# Patient Record
Sex: Female | Born: 1975 | Race: White | Hispanic: No | Smoking: Former smoker
Health system: Southern US, Community
[De-identification: ages and names within clinical notes are randomized; demographics above are authoritative.]

## PROBLEM LIST (undated history)

## (undated) DIAGNOSIS — R000 Tachycardia, unspecified: Secondary | ICD-10-CM

## (undated) DIAGNOSIS — R569 Unspecified convulsions: Secondary | ICD-10-CM

## (undated) DIAGNOSIS — J45909 Unspecified asthma, uncomplicated: Secondary | ICD-10-CM

## (undated) DIAGNOSIS — E119 Type 2 diabetes mellitus without complications: Secondary | ICD-10-CM

## (undated) DIAGNOSIS — J449 Chronic obstructive pulmonary disease, unspecified: Secondary | ICD-10-CM

---

## 2005-08-09 DIAGNOSIS — R569 Unspecified convulsions: Secondary | ICD-10-CM

## 2005-08-09 HISTORY — DX: Unspecified convulsions: R56.9

## 2008-08-09 HISTORY — PX: HERNIA REPAIR: SHX51

## 2016-03-28 ENCOUNTER — Emergency Department (HOSPITAL_BASED_OUTPATIENT_CLINIC_OR_DEPARTMENT_OTHER)
Admission: EM | Admit: 2016-03-28 | Discharge: 2016-03-28 | Disposition: A | Discharge status: Home / Self Care | Payer: Medicaid Other | Attending: Emergency Medicine | Admitting: Emergency Medicine

## 2016-03-28 ENCOUNTER — Encounter (HOSPITAL_BASED_OUTPATIENT_CLINIC_OR_DEPARTMENT_OTHER): Payer: Self-pay | Admitting: *Deleted

## 2016-03-28 ENCOUNTER — Emergency Department (HOSPITAL_BASED_OUTPATIENT_CLINIC_OR_DEPARTMENT_OTHER): Payer: Medicaid Other

## 2016-03-28 DIAGNOSIS — R109 Unspecified abdominal pain: Secondary | ICD-10-CM | POA: Diagnosis present

## 2016-03-28 DIAGNOSIS — E119 Type 2 diabetes mellitus without complications: Secondary | ICD-10-CM | POA: Insufficient documentation

## 2016-03-28 DIAGNOSIS — K439 Ventral hernia without obstruction or gangrene: Secondary | ICD-10-CM | POA: Diagnosis not present

## 2016-03-28 DIAGNOSIS — F17210 Nicotine dependence, cigarettes, uncomplicated: Secondary | ICD-10-CM | POA: Insufficient documentation

## 2016-03-28 DIAGNOSIS — R197 Diarrhea, unspecified: Secondary | ICD-10-CM | POA: Insufficient documentation

## 2016-03-28 DIAGNOSIS — Z794 Long term (current) use of insulin: Secondary | ICD-10-CM | POA: Insufficient documentation

## 2016-03-28 DIAGNOSIS — J449 Chronic obstructive pulmonary disease, unspecified: Secondary | ICD-10-CM | POA: Insufficient documentation

## 2016-03-28 HISTORY — DX: Chronic obstructive pulmonary disease, unspecified: J44.9

## 2016-03-28 HISTORY — DX: Unspecified convulsions: R56.9

## 2016-03-28 HISTORY — DX: Unspecified asthma, uncomplicated: J45.909

## 2016-03-28 HISTORY — DX: Tachycardia, unspecified: R00.0

## 2016-03-28 HISTORY — DX: Type 2 diabetes mellitus without complications: E11.9

## 2016-03-28 LAB — CBC WITH DIFFERENTIAL/PLATELET
BASOS PCT: 0 %
Band Neutrophils: 0 %
Basophils Absolute: 0 10*3/uL (ref 0.0–0.1)
Blasts: 0 %
EOS PCT: 2 %
Eosinophils Absolute: 0.2 10*3/uL (ref 0.0–0.7)
HEMATOCRIT: 43.4 % (ref 36.0–46.0)
HEMOGLOBIN: 14.7 g/dL (ref 12.0–15.0)
Lymphocytes Relative: 30 %
Lymphs Abs: 2.8 10*3/uL (ref 0.7–4.0)
MCH: 30.4 pg (ref 26.0–34.0)
MCHC: 33.9 g/dL (ref 30.0–36.0)
MCV: 89.7 fL (ref 78.0–100.0)
MONO ABS: 0.6 10*3/uL (ref 0.1–1.0)
MONOS PCT: 7 %
MYELOCYTES: 0 %
Metamyelocytes Relative: 0 %
NEUTROS ABS: 5.6 10*3/uL (ref 1.7–7.7)
NRBC: 0 /100{WBCs}
Neutrophils Relative %: 61 %
Platelets: ADEQUATE 10*3/uL (ref 150–400)
Promyelocytes Absolute: 0 %
RBC: 4.84 MIL/uL (ref 3.87–5.11)
RDW: 13.8 % (ref 11.5–15.5)
WBC: 9.2 10*3/uL (ref 4.0–10.5)

## 2016-03-28 LAB — URINALYSIS, ROUTINE W REFLEX MICROSCOPIC
Bilirubin Urine: NEGATIVE
Glucose, UA: NEGATIVE mg/dL
Hgb urine dipstick: NEGATIVE
KETONES UR: NEGATIVE mg/dL
LEUKOCYTES UA: NEGATIVE
NITRITE: NEGATIVE
PROTEIN: NEGATIVE mg/dL
Specific Gravity, Urine: 1.021 (ref 1.005–1.030)
pH: 6.5 (ref 5.0–8.0)

## 2016-03-28 LAB — COMPREHENSIVE METABOLIC PANEL
ALK PHOS: 59 U/L (ref 38–126)
ALT: 15 U/L (ref 14–54)
ANION GAP: 8 (ref 5–15)
AST: 18 U/L (ref 15–41)
Albumin: 4 g/dL (ref 3.5–5.0)
BILIRUBIN TOTAL: 0.2 mg/dL — AB (ref 0.3–1.2)
BUN: 10 mg/dL (ref 6–20)
CALCIUM: 9.1 mg/dL (ref 8.9–10.3)
CO2: 22 mmol/L (ref 22–32)
Chloride: 108 mmol/L (ref 101–111)
Creatinine, Ser: 0.66 mg/dL (ref 0.44–1.00)
GFR calc non Af Amer: >60 mL/min (ref >60)
Glucose, Bld: 92 mg/dL (ref 65–99)
POTASSIUM: 4 mmol/L (ref 3.5–5.1)
SODIUM: 138 mmol/L (ref 135–145)
TOTAL PROTEIN: 7.2 g/dL (ref 6.5–8.1)

## 2016-03-28 LAB — PREGNANCY, URINE: PREG TEST UR: NEGATIVE

## 2016-03-28 LAB — I-STAT CG4 LACTIC ACID, ED: LACTIC ACID, VENOUS: 1.81 mmol/L (ref 0.5–1.9)

## 2016-03-28 LAB — LIPASE, BLOOD: LIPASE: 37 U/L (ref 11–51)

## 2016-03-28 MED ORDER — SODIUM CHLORIDE 0.9 % IV SOLN
Freq: Once | INTRAVENOUS | Status: AC
  Administered 2016-03-28: 12:00:00 via INTRAVENOUS

## 2016-03-28 MED ORDER — ONDANSETRON 4 MG PO TBDP
ORAL_TABLET | ORAL | Status: AC
  Filled 2016-03-28: qty 1

## 2016-03-28 MED ORDER — HYDROCODONE-ACETAMINOPHEN 5-325 MG PO TABS
1.0000 | ORAL_TABLET | Freq: Once | ORAL | Status: AC
  Administered 2016-03-28: 1 via ORAL
  Filled 2016-03-28: qty 1

## 2016-03-28 MED ORDER — ONDANSETRON 4 MG PREPACK (~~LOC~~): 1.0000 | ORAL_TABLET | Freq: Three times a day (TID) | ORAL | 0 refills | Status: DC | PRN

## 2016-03-28 MED ORDER — IOPAMIDOL (ISOVUE-300) INJECTION 61%
100.0000 mL | Freq: Once | INTRAVENOUS | Status: AC | PRN
  Administered 2016-03-28: 100 mL via INTRAVENOUS

## 2016-03-28 MED ORDER — FENTANYL CITRATE (PF) 100 MCG/2ML IJ SOLN
50.0000 ug | Freq: Once | INTRAMUSCULAR | Status: AC
  Administered 2016-03-28: 50 ug via INTRAVENOUS
  Filled 2016-03-28: qty 2

## 2016-03-28 MED ORDER — ONDANSETRON HCL 4 MG/2ML IJ SOLN
4.0000 mg | Freq: Once | INTRAMUSCULAR | Status: DC
  Filled 2016-03-28: qty 2

## 2016-03-28 MED ORDER — ONDANSETRON 4 MG PO TBDP
4.0000 mg | ORAL_TABLET | Freq: Once | ORAL | Status: AC
  Administered 2016-03-28: 4 mg via ORAL

## 2016-03-28 NOTE — ED Provider Notes (Signed)
[facility] Note   CSN: [id number] Arrival date & time: 03/28/16  0906     History   Chief Complaint Chief Complaint  Patient presents with  . Abdominal Pain    incisional hernia    HPI Rebecca Richard is a 40 y.o. female.  Patient states she has a history of an incisional and hernia ever since a C-section in 2010. She did have a repair but states the hernia came back. Her last surgery was in 2010. Her last CT scan was in 2012. She has intermittent pain across her hernia which she said suddenly got worse this morning. She had several episodes of loose stools with some blood streaks which is abnormal for her. She denies any vomiting or fever. Her last vomiting was a week ago which she says is normal as she is normally constipated. She denies any urinary symptoms or vaginal symptoms. She denies any chest pain or shortness of breath. She does have a history of COPD and diabetes. She has not had her medications in several weeks due to being out of her normal living arrangement. However she states her sugars have been well-controlled. She was told she did have a surgery for her hernia repair but nobody would do it due to her comorbidities. States that her abdomen appears more distended than usual. She is not had any vomiting or fever.   The history is provided by the patient.  Abdominal Pain   Associated symptoms include diarrhea. Pertinent negatives include fever, nausea, vomiting, dysuria, hematuria, headaches, arthralgias and myalgias.    Past Medical History:  Diagnosis Date  . COPD (chronic obstructive pulmonary disease) (HCC)   . Diabetes mellitus without complication (HCC)    Past history, stopped taking meds per self several months ago.   . Seizures (HCC) 2007   during surgery for c section.  . Tachycardia     There are no active problems to display for this patient.   Past Surgical History:  Procedure Laterality Date  . CESAREAN SECTION  2007  . HERNIA  REPAIR  2010   Incision hernia repair    OB History    No data available       Home Medications    Prior to Admission medications   Medication Sig Start Date End Date Taking? Authorizing Provider  insulin glargine (LANTUS) 100 UNIT/ML injection Inject into the skin at bedtime.   Yes Historical Provider, MD    Family History No family history on file.  Social History Social History  Substance Use Topics  . Smoking status: Current Every Day Smoker    Packs/day: 0.50    Years: 29.00    Types: Cigarettes  . Smokeless tobacco: Never Used  . Alcohol use No     Allergies   Omnicef [cefdinir]; Other; and Ciprocin-fluocin-procin [fluocinolone]   Review of Systems Review of Systems  Constitutional: Positive for activity change and appetite change. Negative for fatigue and fever.  HENT: Negative for congestion and rhinorrhea.   Eyes: Negative for visual disturbance.  Respiratory: Negative for cough, chest tightness and shortness of breath.   Cardiovascular: Negative for chest pain.  Gastrointestinal: Positive for abdominal pain and diarrhea. Negative for nausea and vomiting.  Genitourinary: Negative for dysuria, hematuria, vaginal bleeding and vaginal discharge.  Musculoskeletal: Negative for arthralgias and myalgias.  Skin: Negative for wound.  Neurological: Negative for dizziness, weakness and headaches.   A complete 10 system review of systems was obtained and all systems are negative except as  noted in the HPI and PMH.   Physical Exam Updated Vital Signs BP 158/87 (BP Location: Right Arm)   Pulse 97   Temp 98.3 F (36.8 C) (Oral)   Resp 24   Ht 5\' 7"  (1.702 m)   Wt 240 lb (108.9 kg)   SpO2 100%   BMI 37.59 kg/m   Physical Exam  Constitutional: She is oriented to person, place, and time. She appears well-developed and well-nourished. No distress.  HENT:  Head: Normocephalic and atraumatic.  Mouth/Throat: Oropharynx is clear and moist. No oropharyngeal  exudate.  Eyes: Conjunctivae and EOM are normal. Pupils are equal, round, and reactive to light.  Neck: Normal range of motion. Neck supple.  No meningismus.  Cardiovascular: Normal rate, regular rhythm, normal heart sounds and intact distal pulses.   No murmur heard. Pulmonary/Chest: Effort normal and breath sounds normal. No respiratory distress. She exhibits no tenderness.  Abdominal: Soft. There is tenderness. There is no rebound and no guarding.  Morbidly obese Tenderness and asymmetric bulging to R lower abdomen across pannus.  No obvious hernia. Entire abdomen soft.   Musculoskeletal: Normal range of motion. She exhibits no edema or tenderness.  No CVAT  Neurological: She is alert and oriented to person, place, and time. No cranial nerve deficit. She exhibits normal muscle tone. Coordination normal.   5/5 strength throughout. CN 2-12 intact.Equal grip strength.   Skin: Skin is warm.  Psychiatric: She has a normal mood and affect. Her behavior is normal.  Nursing note and vitals reviewed.    ED Treatments / Results  Labs (all labs ordered are listed, but only abnormal results are displayed) Labs Reviewed  COMPREHENSIVE METABOLIC PANEL - Abnormal; Notable for the following:       Result Value   Total Bilirubin 0.2 (*)    All other components within normal limits  CBC WITH DIFFERENTIAL/PLATELET  URINALYSIS, ROUTINE W REFLEX MICROSCOPIC (NOT AT [facility])  PREGNANCY, URINE  LIPASE, BLOOD  I-STAT CG4 LACTIC ACID, ED  I-STAT CG4 LACTIC ACID, ED    EKG  EKG Interpretation None       Radiology Ct Abdomen Pelvis W Contrast  Result Date: 03/28/2016 CLINICAL DATA:  Incisional hernia following C-section with subsequent repair. Worsening pain with nausea over the last several days. EXAM: CT ABDOMEN AND PELVIS WITH CONTRAST TECHNIQUE: Multidetector CT imaging of the abdomen and pelvis was performed using the standard protocol following bolus administration of intravenous contrast.  CONTRAST:  100mL ISOVUE-300 IOPAMIDOL (ISOVUE-300) INJECTION 61% COMPARISON:  None. FINDINGS: Lower chest: Lung bases are clear. No pleural or pericardial fluid. Hepatobiliary: Normal appearance of the liver. No calcified gallstones. No ductal dilatation. Pancreas: Normal Spleen: Normal Adrenals/Urinary Tract: Normal adrenal glands.  Normal kidneys. Stomach/Bowel: Normal intrinsically. No obstruction or focal lesion. Large abdominal wall hernia to the left of center with the hernia defect measuring 8 cm in diameter. There is a large amount of herniated mesentery, small bowel and large bowel. Vascular/Lymphatic: Aortic atherosclerosis. No aneurysm. The IVC is normal. No retroperitoneal mass or adenopathy. Reproductive: Uterus and adnexal regions are normal. Other: None. Musculoskeletal:  Negative IMPRESSION: Large anterior abdominal wall hernia to left to midline with the hernia defect measuring 8 cm in diameter. Hernia contains a large amount of mesenteric fat, small intestine and large intestine. No evidence of obstruction or incarceration. Electronically Signed   By: Paulina FusiMark  Shogry M.D.   On: 03/28/2016 13:18    Procedures Procedures (including critical care time)  Medications Ordered in ED Medications  0.9 %  sodium chloride infusion (not administered)  ondansetron (ZOFRAN) injection 4 mg (not administered)     Initial Impression / Assessment and Plan / ED Course  I have reviewed the triage vital signs and the nursing notes.  Pertinent labs & imaging results that were available during my care of the patient were reviewed by me and considered in my medical decision making (see chart for details).  Clinical Course  Patient with worsening abdominal pain at site of apparent incisional hernia. There is no obvious hernia on exam. Her abdomen is soft but difficult to examine due to her obesity. We'll obtain labs and imaging.  Labs are normal. Including lactate. No urine infection. No hyperglycemia,  no DKA.  CT shows large ventral hernia.  No evidence of obstruction or inflammation.  Hernia not fully reducible, but likely chronically so.  D/w Dr. Carolynne Edouardoth of surgery who agrees with outpatient followup. Surgical referral given.  Sugars well controlled despite patient not having insulin for several weeks. Advised to followup with PCP for recheck.  Return precautions discussed.   Final Clinical Impressions(s) / ED Diagnoses   Final diagnoses:  Ventral hernia, recurrence not specified    New Prescriptions New Prescriptions   No medications on file     Glynn OctaveStephen Yesena Reaves, MD 03/28/16 1713

## 2016-03-28 NOTE — Discharge Instructions (Signed)
Follow up with a surgeon to repair your hernia. Return to the ED if you develop worsening pain, fever, vomiting or any other concerns.

## 2016-03-28 NOTE — ED Notes (Signed)
Unsuccessful IV attempt x 2 with ultrasound.  Dr. Manus Gunningancour aware.

## 2016-03-28 NOTE — ED Triage Notes (Signed)
Patient states she has a two or three year history of incision hernia's post c-section.  Has had one repaired and now has returned.  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  Did not have any repair done at the time.  States for the last two days, she has had increased lower abdominal pain with enlarged protruding hernia, and this morning developed diarrhea.

## 2016-04-28 ENCOUNTER — Encounter (HOSPITAL_BASED_OUTPATIENT_CLINIC_OR_DEPARTMENT_OTHER): Payer: Self-pay

## 2016-04-28 ENCOUNTER — Emergency Department (HOSPITAL_BASED_OUTPATIENT_CLINIC_OR_DEPARTMENT_OTHER)
Admission: EM | Admit: 2016-04-28 | Discharge: 2016-04-28 | Disposition: A | Discharge status: Home / Self Care | Payer: Medicaid Other | Attending: Emergency Medicine | Admitting: Emergency Medicine

## 2016-04-28 ENCOUNTER — Emergency Department (HOSPITAL_BASED_OUTPATIENT_CLINIC_OR_DEPARTMENT_OTHER): Payer: Medicaid Other

## 2016-04-28 DIAGNOSIS — J449 Chronic obstructive pulmonary disease, unspecified: Secondary | ICD-10-CM | POA: Diagnosis not present

## 2016-04-28 DIAGNOSIS — J45909 Unspecified asthma, uncomplicated: Secondary | ICD-10-CM | POA: Insufficient documentation

## 2016-04-28 DIAGNOSIS — M25551 Pain in right hip: Secondary | ICD-10-CM | POA: Diagnosis present

## 2016-04-28 DIAGNOSIS — Z79899 Other long term (current) drug therapy: Secondary | ICD-10-CM | POA: Insufficient documentation

## 2016-04-28 DIAGNOSIS — E119 Type 2 diabetes mellitus without complications: Secondary | ICD-10-CM | POA: Insufficient documentation

## 2016-04-28 DIAGNOSIS — F17210 Nicotine dependence, cigarettes, uncomplicated: Secondary | ICD-10-CM | POA: Insufficient documentation

## 2016-04-28 LAB — PREGNANCY, URINE: PREG TEST UR: NEGATIVE

## 2016-04-28 MED ORDER — IBUPROFEN 600 MG PO TABS: 600.0000 mg | ORAL_TABLET | Freq: Four times a day (QID) | ORAL | 0 refills | Status: AC | PRN

## 2016-04-28 MED ORDER — KETOROLAC TROMETHAMINE 60 MG/2ML IM SOLN
60.0000 mg | Freq: Once | INTRAMUSCULAR | Status: AC
  Administered 2016-04-28: 60 mg via INTRAMUSCULAR
  Filled 2016-04-28: qty 2

## 2016-04-28 MED ORDER — METHOCARBAMOL 500 MG PO TABS: 1000.0000 mg | ORAL_TABLET | Freq: Three times a day (TID) | ORAL | 0 refills | Status: DC | PRN

## 2016-04-28 NOTE — ED Notes (Signed)
Dr. Ranae PalmsYelverton notified of pt's request for pain medication

## 2016-04-28 NOTE — ED Notes (Signed)
MD at bedside. 

## 2016-04-28 NOTE — ED Triage Notes (Signed)
C/o "pop" to right hip approx 7am while lifting left leg

## 2016-04-28 NOTE — ED Notes (Signed)
Patient transported to X-ray 

## 2016-04-28 NOTE — ED Notes (Signed)
Directed to pharmacy to pick up prescriptions. Ice pack given for home use 

## 2016-04-28 NOTE — Discharge Instructions (Signed)
Follow-up with Dr. Pearletha ForgeHudnall if your symptoms are not improving with anti-inflammatory, muscle relaxants, rest, ice and elevation.

## 2016-04-28 NOTE — ED Notes (Signed)
MD at bedside discussing f/u care

## 2016-04-28 NOTE — ED Provider Notes (Signed)
[facility]   CSN: [id number] Arrival date & time: 04/28/16  1052     History   Chief Complaint Chief Complaint  Patient presents with  . Hip Pain    HPI Rebecca Richard is a 40 y.o. female.  HPI Patient presents with right hip pain. States she was raising her left leg this morning and she heard a pop in her right hip. She's had pain since. No swelling noted. She's been able to weight bear on the leg since onset of symptoms. No numbness or tingling. No weakness. No previous hip surgery. Past Medical History:  Diagnosis Date  . Asthma   . COPD (chronic obstructive pulmonary disease) (HCC)   . Diabetes mellitus without complication (HCC)    Past history, stopped taking meds per self several months ago.   . Seizures (HCC) 2007   during surgery for c section.  . Tachycardia     There are no active problems to display for this patient.   Past Surgical History:  Procedure Laterality Date  . CESAREAN SECTION  2007  . HERNIA REPAIR  2010   Incision hernia repair    OB History    No data available       Home Medications    Prior to Admission medications   Medication Sig Start Date End Date Taking? Authorizing Provider  DULoxetine (CYMBALTA) 60 MG capsule Take 60 mg by mouth daily.   Yes Historical Provider, MD  esomeprazole (NEXIUM) 40 MG capsule Take 40 mg by mouth daily at 12 noon.   Yes Historical Provider, MD  lisinopril (PRINIVIL,ZESTRIL) 10 MG tablet Take 10 mg by mouth daily.   Yes Historical Provider, MD  montelukast (SINGULAIR) 10 MG tablet Take 10 mg by mouth at bedtime.   Yes Historical Provider, MD  nicotine (NICODERM CQ - DOSED IN MG/24 HOURS) 14 mg/24hr patch Place 14 mg onto the skin daily.   Yes Historical Provider, MD  ibuprofen (ADVIL,MOTRIN) 600 MG tablet Take 1 tablet (600 mg total) by mouth every 6 (six) hours as needed. 04/28/16   Loren Racer, MD  methocarbamol (ROBAXIN) 500 MG tablet Take 2 tablets (1,000 mg total) by  mouth every 8 (eight) hours as needed for muscle spasms. 04/28/16   Loren Racer, MD    Family History No family history on file.  Social History Social History  Substance Use Topics  . Smoking status: Current Every Day Smoker    Packs/day: 0.50    Years: 29.00    Types: Cigarettes  . Smokeless tobacco: Never Used  . Alcohol use No     Allergies   Omnicef [cefdinir]; Other; Ciprofloxacin; and Ciprocin-fluocin-procin [fluocinolone]   Review of Systems Review of Systems  Constitutional: Negative for chills and fever.  Respiratory: Negative for shortness of breath.   Cardiovascular: Negative for chest pain.  Gastrointestinal: Negative for abdominal pain, diarrhea, nausea and vomiting.  Musculoskeletal: Positive for arthralgias. Negative for back pain, myalgias, neck pain and neck stiffness.  Neurological: Negative for dizziness, weakness, light-headedness, numbness and headaches.  All other systems reviewed and are negative.    Physical Exam Updated Vital Signs BP (!) 152/108 (BP Location: Left Arm)   Pulse 119   Temp 98.1 F (36.7 C) (Oral)   Resp 24   Ht 5\' 7"  (1.702 m)   Wt 238 lb (108 kg)   SpO2 100%   BMI 37.28 kg/m   Physical Exam  Constitutional: She is oriented to person, place, and time. She appears  well-developed and well-nourished. No distress.  Anxious  HENT:  Head: Normocephalic and atraumatic.  Eyes: EOM are normal. Pupils are equal, round, and reactive to light.  Neck: Normal range of motion. Neck supple.  Pulmonary/Chest: Effort normal.  Abdominal: Soft. Bowel sounds are normal. There is no tenderness. There is no rebound and no guarding.  Musculoskeletal: Normal range of motion. She exhibits tenderness. She exhibits no edema.  Patient has tender to palpation over the lateral right hip. There is no lower extremity length discrepancy. 2+ dorsalis pedis and posterior tibial pulses. Full range of motion of the left hip. Pain appears to be  exacerbated with external rotation. No lower extremity swelling or asymmetry.  Neurological: She is alert and oriented to person, place, and time.  5/5 motor in all extremities. Sensation is intact.  Skin: Skin is warm and dry. Capillary refill takes less than 2 seconds. No rash noted. No erythema.  Psychiatric: She has a normal mood and affect. Her behavior is normal.  Nursing note and vitals reviewed.    ED Treatments / Results  Labs (all labs ordered are listed, but only abnormal results are displayed) Labs Reviewed  PREGNANCY, URINE    EKG  EKG Interpretation None       Radiology Dg Hip Unilat W Or Wo Pelvis 2-3 Views Right  Result Date: 04/28/2016 CLINICAL DATA:  Right hip pain and right hip popping. EXAM: DG HIP (WITH OR WITHOUT PELVIS) 2-3V RIGHT COMPARISON:  None. FINDINGS: Right hip appears to be located. No evidence for an acute fracture. Pelvic bony ring is intact. There is bowel gas overlying the proximal left femur and this is related to the patient's known large ventral hernia. IMPRESSION: No acute bone abnormality in the pelvis or right hip. Large abdominal hernia containing bowel. Electronically Signed   By: Richarda OverlieAdam  Henn M.D.   On: 04/28/2016 12:39    Procedures Procedures (including critical care time)  Medications Ordered in ED Medications  ketorolac (TORADOL) injection 60 mg (not administered)     Initial Impression / Assessment and Plan / ED Course  I have reviewed the triage vital signs and the nursing notes.  Pertinent labs & imaging results that were available during my care of the patient were reviewed by me and considered in my medical decision making (see chart for details).  Clinical Course   No evidence of injury on x-ray. Suspect muscular injury. Treat with NSAIDs, muscle relaxant and RICE. We'll give follow-up with Dr. Pearletha ForgeHudnall patient's symptoms aren't improving with this treatment. Return precautions given.   Final Clinical Impressions(s) /  ED Diagnoses   Final diagnoses:  Right hip pain    New Prescriptions New Prescriptions   IBUPROFEN (ADVIL,MOTRIN) 600 MG TABLET    Take 1 tablet (600 mg total) by mouth every 6 (six) hours as needed.   METHOCARBAMOL (ROBAXIN) 500 MG TABLET    Take 2 tablets (1,000 mg total) by mouth every 8 (eight) hours as needed for muscle spasms.     Loren Raceravid Jalina Blowers, MD 04/28/16 1300

## 2016-04-28 NOTE — ED Notes (Signed)
Family at bedside. 

## 2016-04-28 NOTE — ED Notes (Signed)
Pt able to ambulate with limp. Standby Assistance used.

## 2016-04-28 NOTE — ED Notes (Signed)
Family at bedside.   Pt was able to ambulate to the bathroom without assistance. Pt requesting pain medicine, RN notified.

## 2016-05-19 ENCOUNTER — Emergency Department (HOSPITAL_BASED_OUTPATIENT_CLINIC_OR_DEPARTMENT_OTHER)
Admission: EM | Admit: 2016-05-19 | Discharge: 2016-05-19 | Disposition: A | Discharge status: Home / Self Care | Payer: Medicaid Other | Attending: Emergency Medicine | Admitting: Emergency Medicine

## 2016-05-19 ENCOUNTER — Encounter (HOSPITAL_BASED_OUTPATIENT_CLINIC_OR_DEPARTMENT_OTHER): Payer: Self-pay

## 2016-05-19 DIAGNOSIS — K029 Dental caries, unspecified: Secondary | ICD-10-CM | POA: Diagnosis not present

## 2016-05-19 DIAGNOSIS — J449 Chronic obstructive pulmonary disease, unspecified: Secondary | ICD-10-CM | POA: Diagnosis not present

## 2016-05-19 DIAGNOSIS — J45909 Unspecified asthma, uncomplicated: Secondary | ICD-10-CM | POA: Diagnosis not present

## 2016-05-19 DIAGNOSIS — E119 Type 2 diabetes mellitus without complications: Secondary | ICD-10-CM | POA: Diagnosis not present

## 2016-05-19 DIAGNOSIS — Z79899 Other long term (current) drug therapy: Secondary | ICD-10-CM | POA: Insufficient documentation

## 2016-05-19 DIAGNOSIS — Z87891 Personal history of nicotine dependence: Secondary | ICD-10-CM | POA: Diagnosis not present

## 2016-05-19 DIAGNOSIS — K0889 Other specified disorders of teeth and supporting structures: Secondary | ICD-10-CM | POA: Diagnosis present

## 2016-05-19 NOTE — ED Provider Notes (Signed)
[facility]   CSN: [id number] Arrival date & time: 05/19/16  1307     History   Chief Complaint Chief Complaint  Patient presents with  . Dental Pain    HPI Rebecca Richard is a 40 y.o. female.  HPI   40 year old female presents today with dental pain. Patient has a history of numerous dental caries status post extraction. Patient reports intermittent right upper dental pain worse with cold or hot. Patient notes that yesterday symptoms significantly worsen, she notes calling her dentist tooth was able to schedule a visit in November of this year. Patient denies any swelling, fever, or any other infectious etiology. Patient reports using ibuprofen with no significant improvement in her pain.    Past Medical History:  Diagnosis Date  . Asthma   . COPD (chronic obstructive pulmonary disease) (HCC)   . Diabetes mellitus without complication (HCC)    Past history, stopped taking meds per self several months ago.   . Seizures (HCC) 2007   during surgery for c section.  . Tachycardia     There are no active problems to display for this patient.   Past Surgical History:  Procedure Laterality Date  . CESAREAN SECTION  2007  . HERNIA REPAIR  2010   Incision hernia repair    OB History    No data available       Home Medications    Prior to Admission medications   Medication Sig Start Date End Date Taking? Authorizing Provider  METOPROLOL TARTRATE PO Take by mouth.   Yes Historical Provider, MD  DULoxetine (CYMBALTA) 60 MG capsule Take 60 mg by mouth daily.    Historical Provider, MD  esomeprazole (NEXIUM) 40 MG capsule Take 40 mg by mouth daily at 12 noon.    Historical Provider, MD  ibuprofen (ADVIL,MOTRIN) 600 MG tablet Take 1 tablet (600 mg total) by mouth every 6 (six) hours as needed. 04/28/16   Loren Raceravid Yelverton, MD  lisinopril (PRINIVIL,ZESTRIL) 10 MG tablet Take 10 mg by mouth daily.    Historical Provider, MD  montelukast (SINGULAIR) 10  MG tablet Take 10 mg by mouth at bedtime.    Historical Provider, MD  nicotine (NICODERM CQ - DOSED IN MG/24 HOURS) 14 mg/24hr patch Place 14 mg onto the skin daily.    Historical Provider, MD    Family History No family history on file.  Social History Social History  Substance Use Topics  . Smoking status: Former Smoker    Packs/day: 0.50    Years: 29.00  . Smokeless tobacco: Never Used  . Alcohol use No     Allergies   Omnicef [cefdinir]; Other; Ciprofloxacin; and Ciprocin-fluocin-procin [fluocinolone]   Review of Systems Review of Systems  All other systems reviewed and are negative.    Physical Exam Updated Vital Signs BP 129/73 (BP Location: Right Arm)   Pulse 82   Temp 98.4 F (36.9 C) (Oral)   Resp 16   Ht 5\' 7"  (1.702 m)   Wt 113.9 kg   SpO2 99%   BMI 39.31 kg/m   Physical Exam  Constitutional: She is oriented to person, place, and time. She appears well-developed and well-nourished. No distress.  HENT:  Head: Normocephalic and atraumatic.  Mouth/Throat: Uvula is midline, oropharynx is clear and moist and mucous membranes are normal. No oropharyngeal exudate, posterior oropharyngeal edema, posterior oropharyngeal erythema or tonsillar abscesses.  External exam shows no asymmetry of the jaw line or face, no signs of obvious swelling,  edema, infection. Full active range of motion of the jaw. Neck is supple with full active range of motion, no tenderness to palpation of the soft tissues  Numerous dental caries and missing teeth    Gumline palpated no obvious signs of infection including warmth, redness, abscess, tenderness. Posterior oropharynx clear with no signs of infection, uvula is midline and rises with phonation, tonsils present and normal in size, symmetrical bilateral, tongue is normal soft touch with full active range of motion, floor mouth is soft nontender.  Eyes: Conjunctivae are normal. Pupils are equal, round, and reactive to light. Right eye  exhibits no discharge. Left eye exhibits no discharge. No scleral icterus.  Neck: Normal range of motion. Neck supple. No JVD present. No tracheal deviation present. No thyromegaly present.  Pulmonary/Chest: Effort normal. No stridor.  Lymphadenopathy:    She has no cervical adenopathy.  Neurological: She is alert and oriented to person, place, and time. Coordination normal.  Skin: Skin is warm and dry. No rash noted. She is not diaphoretic. No erythema. No pallor.  Psychiatric: She has a normal mood and affect. Her behavior is normal. Judgment and thought content normal.  Nursing note and vitals reviewed.    ED Treatments / Results  Labs (all labs ordered are listed, but only abnormal results are displayed) Labs Reviewed - No data to display  EKG  EKG Interpretation None       Radiology No results found.  Procedures Procedures (including critical care time)  Medications Ordered in ED Medications - No data to display   Initial Impression / Assessment and Plan / ED Course  I have reviewed the triage vital signs and the nursing notes.  Pertinent labs & imaging results that were available during my care of the patient were reviewed by me and considered in my medical decision making (see chart for details).  Clinical Course     Final Clinical Impressions(s) / ED Diagnoses   Final diagnoses:  Pain, dental    Labs:   Imaging:  Consults:  Therapeutics:  Discharge Meds:   Assessment/Plan:  Patient presents with some compensated down pain, she will be encouraged follow-up with dental resources.      New Prescriptions Discharge Medication List as of 05/19/2016  3:32 PM       Eyvonne Mechanic, PA-C 05/19/16 1610    Marily Memos, MD 05/20/16 1545

## 2016-05-19 NOTE — ED Triage Notes (Signed)
C/o upper right toothache x 2 days-NAD-steady gait

## 2016-05-19 NOTE — Discharge Instructions (Signed)
Please read attached information. If you experience any new or worsening signs or symptoms please return to the emergency room for evaluation. Please follow-up with your primary care provider or specialist as discussed.  °

## 2017-08-03 IMAGING — CT CT ABD-PELV W/ CM
2 of 5 series · 17 of 46 positions shown, 19 images · IV contrast (APPLIED)
Comparison: None.

CLINICAL DATA: Incisional hernia following C-section with
subsequent repair. Worsening pain with nausea over the last several
days.

EXAM:
CT ABDOMEN AND PELVIS WITH CONTRAST
TECHNIQUE: Multidetector CT imaging of the abdomen and pelvis was performed
using the standard protocol following bolus administration of
intravenous contrast.
CONTRAST:  100mL QGEAKD-HPP IOPAMIDOL (QGEAKD-HPP) INJECTION 61%

[Series 2: axial st · axial · 0.98mm/px · z∈[-555,-95]mm · 14 of 104 slices shown, 16 images]
[im 6/104  soft-tissue]
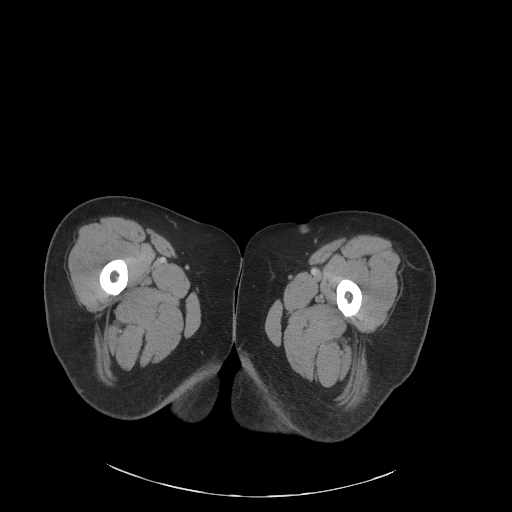
[im 6/104  bone]
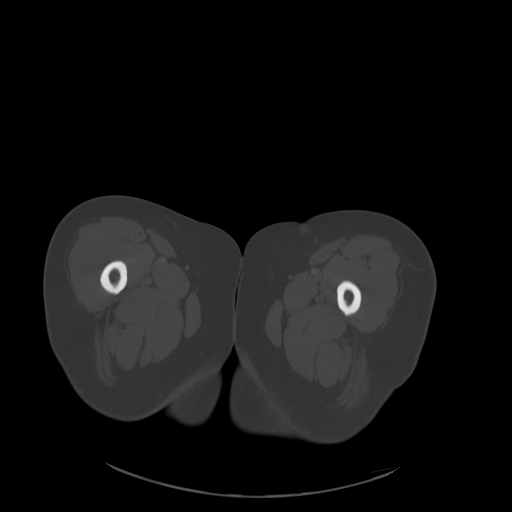
[im 11/104  soft-tissue]
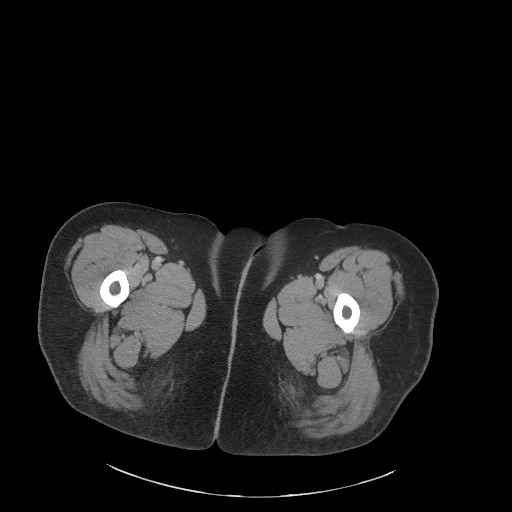
[im 22/104  soft-tissue]
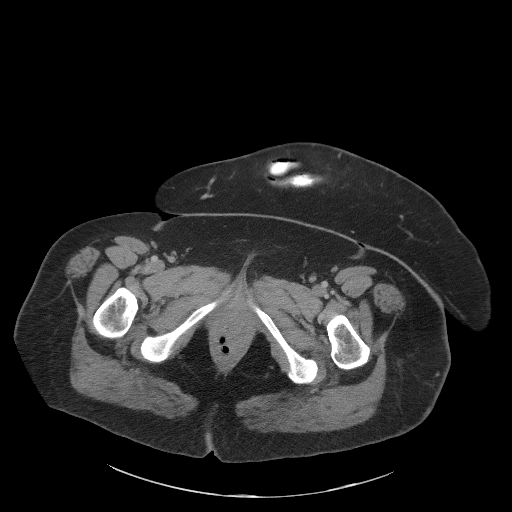
[im 28/104  soft-tissue]
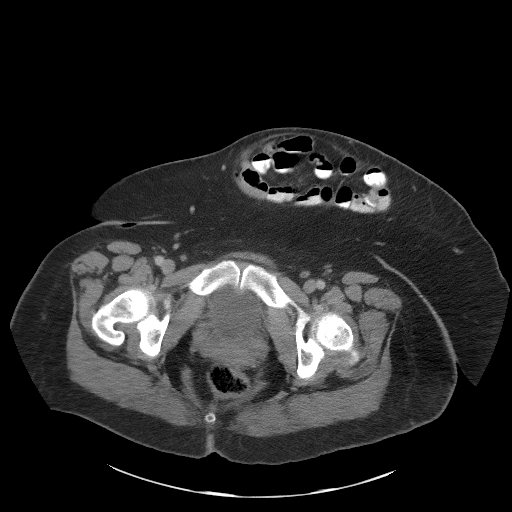
[im 33/104  soft-tissue]
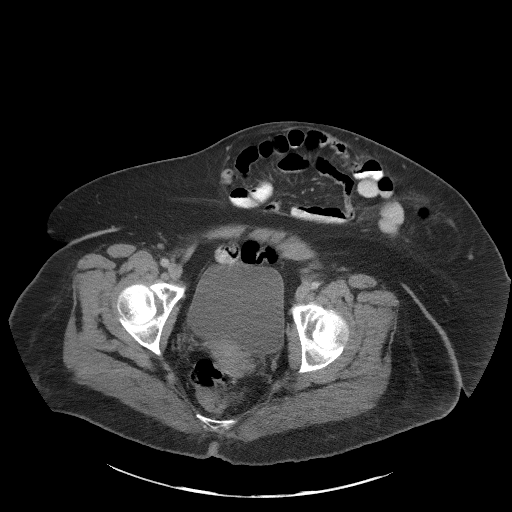
[im 44/104  soft-tissue]
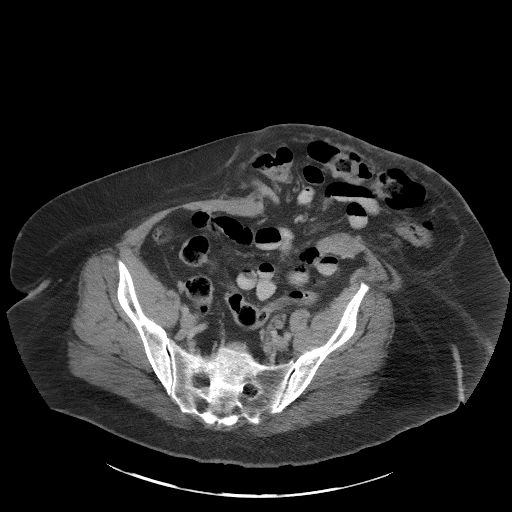
[im 49/104  soft-tissue]
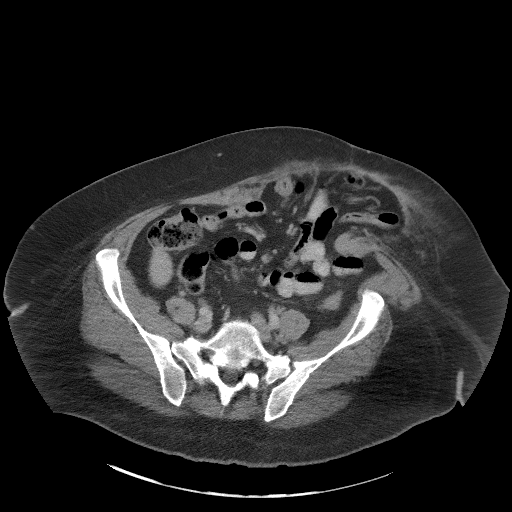
[im 55/104  soft-tissue]
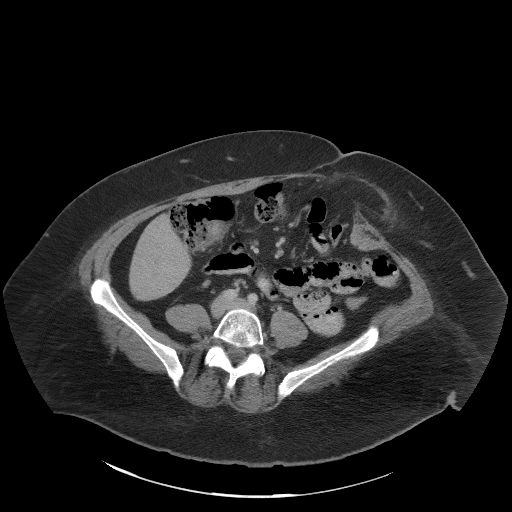
[im 60/104  soft-tissue]
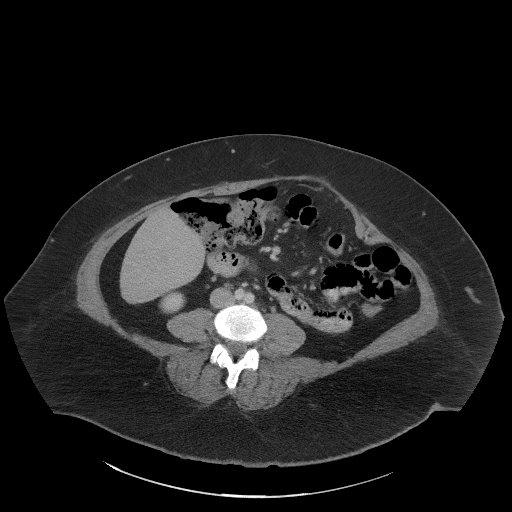
[im 60/104  bone]
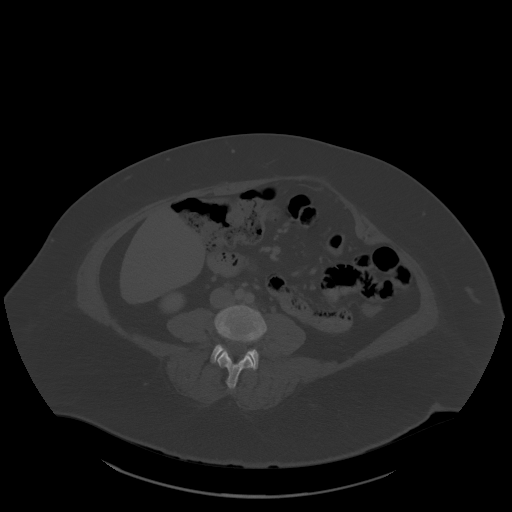
[im 71/104  soft-tissue]
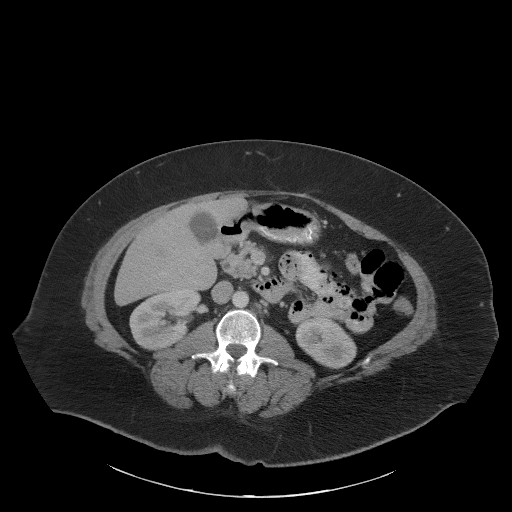
[im 76/104  soft-tissue]
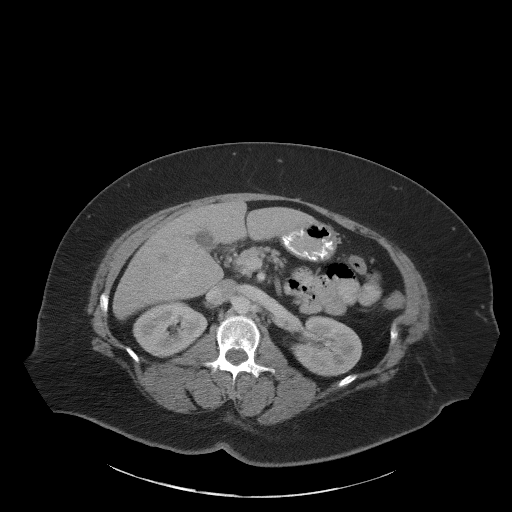
[im 82/104  soft-tissue]
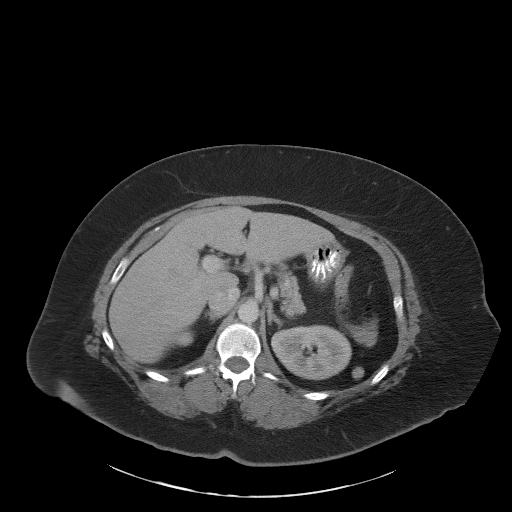
[im 93/104  soft-tissue]
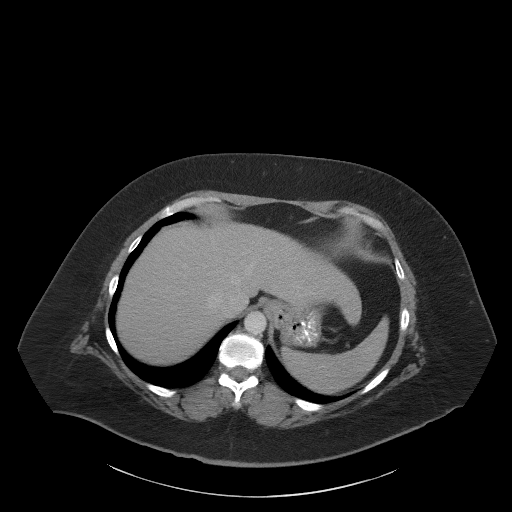
[im 98/104  soft-tissue]
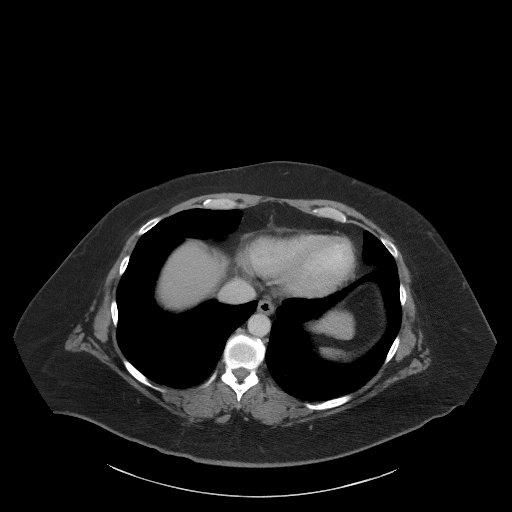

[Series 5: coronal st · coronal · 1.06mm/px · 3 of 107 slices shown]
[im 36/107  soft-tissue]
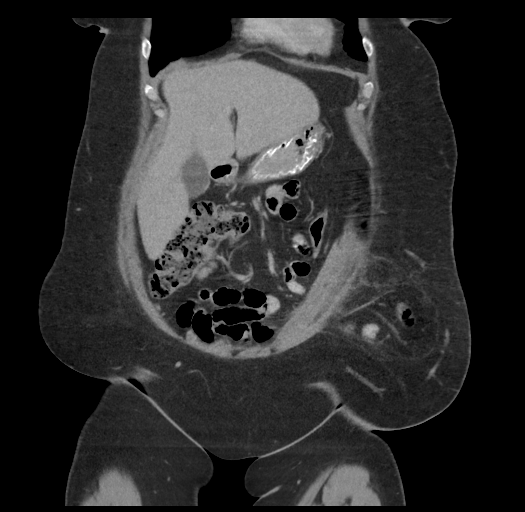
[im 48/107  soft-tissue]
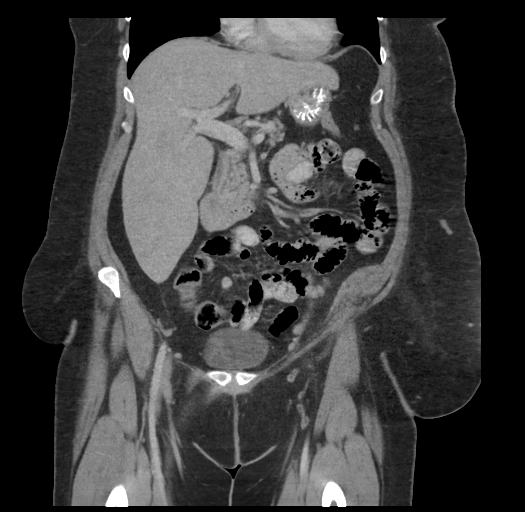
[im 59/107  soft-tissue]
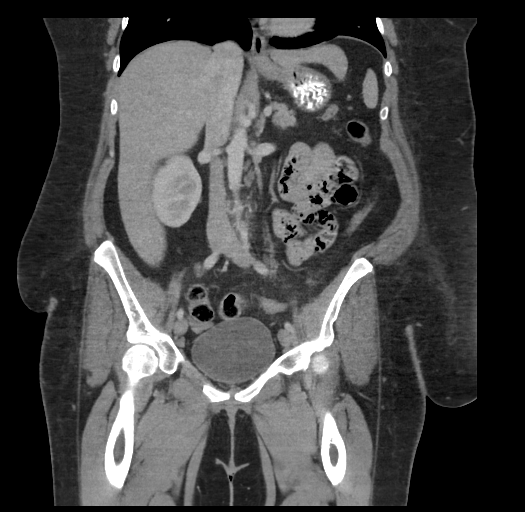

[17 of 46 positions shown; findings below may reference images not displayed]

FINDINGS: Lower chest: Lung bases are clear. No pleural or pericardial fluid.

Hepatobiliary: Normal appearance of the liver. No calcified
gallstones. No ductal dilatation.

Pancreas: Normal

Spleen: Normal

Adrenals/Urinary Tract: Normal adrenal glands.  Normal kidneys.

Stomach/Bowel: Normal intrinsically. No obstruction or focal lesion.
Large abdominal wall hernia to the left of center with the hernia
defect measuring 8 cm in diameter. There is a large amount of
herniated mesentery, small bowel and large bowel.

Vascular/Lymphatic: Aortic atherosclerosis. No aneurysm. The IVC is
normal. No retroperitoneal mass or adenopathy.

Reproductive: Uterus and adnexal regions are normal.

Other: None.

Musculoskeletal:  Negative
IMPRESSION: Large anterior abdominal wall hernia to left to midline with the
hernia defect measuring 8 cm in diameter. Hernia contains a large
amount of mesenteric fat, small intestine and large intestine. No
evidence of obstruction or incarceration.

## 2023-05-21 LAB — COLOGUARD: COLOGUARD: NEGATIVE

## 2024-01-13 ENCOUNTER — Other Ambulatory Visit: Payer: Self-pay

## 2024-01-13 ENCOUNTER — Ambulatory Visit: Admitting: Internal Medicine

## 2024-02-29 ENCOUNTER — Encounter: Payer: Self-pay | Admitting: Internal Medicine

## 2024-02-29 ENCOUNTER — Ambulatory Visit (INDEPENDENT_AMBULATORY_CARE_PROVIDER_SITE_OTHER): Admitting: Internal Medicine

## 2024-02-29 VITALS — BP 122/74 | HR 121 | Temp 97.7°F | Resp 22 | Ht 67.0 in | Wt 194.0 lb

## 2024-02-29 DIAGNOSIS — J4489 Other specified chronic obstructive pulmonary disease: Secondary | ICD-10-CM | POA: Insufficient documentation

## 2024-02-29 DIAGNOSIS — K219 Gastro-esophageal reflux disease without esophagitis: Secondary | ICD-10-CM | POA: Insufficient documentation

## 2024-02-29 DIAGNOSIS — F1911 Other psychoactive substance abuse, in remission: Secondary | ICD-10-CM | POA: Insufficient documentation

## 2024-02-29 DIAGNOSIS — Z599 Problem related to housing and economic circumstances, unspecified: Secondary | ICD-10-CM | POA: Insufficient documentation

## 2024-02-29 DIAGNOSIS — J310 Chronic rhinitis: Secondary | ICD-10-CM | POA: Insufficient documentation

## 2024-02-29 DIAGNOSIS — Z720 Tobacco use: Secondary | ICD-10-CM | POA: Insufficient documentation

## 2024-02-29 MED ORDER — IPRATROPIUM BROMIDE 0.06 % NA SOLN: 2.0000 | Freq: Three times a day (TID) | NASAL | 5 refills | Status: AC | PRN

## 2024-02-29 MED ORDER — LEVOCETIRIZINE DIHYDROCHLORIDE 5 MG PO TABS: 5.0000 mg | ORAL_TABLET | Freq: Every evening | ORAL | 5 refills | Status: AC | PRN

## 2024-02-29 MED ORDER — ESOMEPRAZOLE MAGNESIUM 40 MG PO CPDR: 40.0000 mg | DELAYED_RELEASE_CAPSULE | Freq: Every day | ORAL | 1 refills | Status: AC

## 2024-02-29 NOTE — Patient Instructions (Addendum)
 Chronic rhinitis with gustatory component. Chronic rhinitis with perennial symptoms and seasonal exacerbations, likely due to pollen and trees.  - Schedule allergy testing for next Wednesday at 2:30 PM.1-55 - Prescribe Xyzal  5 mg daily as needed for allergy management, to be started after allergy testing. - Advise use of nasal saline spray to maintain nasal moisture.Use prior to medicated nasal sprays.  Gustatory rhinitis Chronic runny nose exacerbated by eating, consistent with gustatory rhinitis. Ipratropium nasal spray does not cause drowsiness but may cause dryness. - Recommend ipratropium nasal spray up to three times daily, especially before meals. Use saline spray prior to this to prevent dryness.  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 Smoking cessation has improved symptoms. No recent exacerbations requiring steroids. - Perform breathing test today. - Continue Ventolin inhaler as needed for wheezing and shortness of breath. 2-4 puff every 4 hours as needed for wheezing or shortness of breath - Encourage continued smoking cessation efforts. -continue singulair 10 mg nightly - stop if behavior changes or nightmares - consider Pulmonary referral if symptoms worsen   History of smoking Former smoker with a 32-year history, currently using vaping as a cessation aid. Continued vaping may contribute to respiratory issues. - Encourage complete cessation of vaping to further improve respiratory health.  Environmental factors Exposure to mold and mildew in home environment, potentially exacerbating respiratory symptoms. - Suggest use of a dehumidifier to reduce moisture and mold growth in the home.  Gastroesophageal reflux disease (GERD) Intermittent GERD symptoms with occasional heartburn, potentially contributing to throat irritation and mucus production. - Restart Nexium  40 mg daily for six weeks to manage GERD symptoms.   Follow up :  next Wednesday July 30th at 2:30 PM (1-55). Must be off all anthistamines 3 days prior to visit. It was a pleasure meeting you in clinic today! Thank you for allowing me to participate in your care.  Rocky Endow, MD [facility]

## 2024-02-29 NOTE — Progress Notes (Signed)
 NEW PATIENT Date of Service/Encounter:   02/29/2024 Referring provider: Cameron Harlene Rumalda JULIANNA* Primary care provider: [facility]  Subjective:  Rebecca Richard is a 48 y.o. female with a PMHx of diabetes mellitus presenting today for evaluation of chronic rhinitis, COPD with asthma. History obtained from: chart review and patient.   Discussed the use of AI scribe software for clinical note transcription with the patient, who gave verbal consent to proceed.  History of Present Illness Rebecca Richard is a 48 year old female with allergies and asthma who presents with worsening allergy symptoms and respiratory issues.  Allergic rhinitis and ocular symptoms - Chronic runny nose present year-round, worsens when eating, causing embarrassment - Itchy and sometimes watery eyes, symptoms worsen during spring and summer - No prior allergy testing performed to identify specific allergens - Singulair used for allergy management - Over-the-counter allergy medications such as Zyrtec caused drowsiness - Nasal sprays and sinus rinses used in the past, but hesitancy to use certain nasal sprays due to history of addiction and triggering sensations  Postnasal drip and throat clearing - Sensation of drainage in the throat, worsening over time - Occurs two to three times per week, requiring prolonged throat clearing until expulsion of a 'big thick glob of stuff'; reviewed pictures on phone and showed thick yellow mucus  - Occasional coughing, less severe than when smoking cigarettes  Asthma and respiratory symptoms - Asthma diagnosed at age 71 - Ventolin used as needed - Shortness of breath with exertion - No recent hospitalizations for asthma, but history of prior hospitalizations - No use of inhaled steroids for several years; previously on advair.  Environmental exposures - Resides in low-income housing with mold and mildew issues, believed to exacerbate respiratory symptoms  Tobacco and  nicotine use - History of cigarette smoking for 32 years - Transitioned to vaping one year ago  Gastroesophageal reflux symptoms - Occasional acid reflux, particularly after eating certain foods such as spaghetti - Nexium  used as needed, approximately once every two to three months  Medication allergies - Allergic reactions to Cipro, Mycins, and Omnicef, with severe reactions to Omnicef requiring hospital visit  Chronic pain syndromes - History of fibromyalgia and degenerative disc disease causing daily pain - Avoidance of pain medications due to past substance abuse   Past Medical History: Past Medical History:  Diagnosis Date   Asthma    COPD (chronic obstructive pulmonary disease) (HCC)    Diabetes mellitus without complication (HCC)    Past history, stopped taking meds per self several months ago.    Seizures (HCC) 2007   during surgery for c section.   Tachycardia    Medication List:  Current Outpatient Medications  Medication Sig Dispense Refill   buPROPion ER (WELLBUTRIN SR) 100 MG 12 hr tablet      Continuous Glucose Sensor (DEXCOM G6 SENSOR) MISC Inject 1 sensor to the skin every 10 days for continuous glucose monitoring.     Continuous Glucose Transmitter (DEXCOM G6 TRANSMITTER) MISC Use as directed for continuous glucose monitoring. Reuse transmitter for 90 days then discard and replace.     DULoxetine (CYMBALTA) 60 MG capsule Take 60 mg by mouth daily.     esomeprazole  (NEXIUM ) 40 MG capsule Take 40 mg by mouth daily at 12 noon.     ibuprofen  (ADVIL ,MOTRIN ) 600 MG tablet Take 1 tablet (600 mg total) by mouth every 6 (six) hours as needed. 30 tablet 0   Insulin Pen Needle (PEN NEEDLES) 32G X 5 MM  MISC Use one pen needle daily to inject insulin; dx e11.9     ipratropium (ATROVENT ) 0.06 % nasal spray Administer 2 sprays into each nostril 3 (three) times a day.     ketoconazole (NIZORAL) 2 % cream Apply topically daily.     Lidocaine HCl (SOLARCAINE ALOE EXTRA EX)       lisinopril (ZESTRIL) 20 MG tablet Take 1 tablet by mouth daily.     medroxyPROGESTERone (DEPO-PROVERA) 150 MG/ML injection Inject 150 mg into the muscle.     metoprolol succinate (TOPROL-XL) 50 MG 24 hr tablet      montelukast (SINGULAIR) 10 MG tablet Take 10 mg by mouth at bedtime.     nicotine (NICODERM CQ - DOSED IN MG/24 HOURS) 14 mg/24hr patch Place 14 mg onto the skin daily.     OZEMPIC, 1 MG/DOSE, 4 MG/3ML SOPN Inject 1 mg into the skin.     PARoxetine (PAXIL) 20 MG tablet      polyethylene glycol powder (GAVILAX) 17 GM/SCOOP powder Take by mouth.     pregabalin (LYRICA) 150 MG capsule Take 150 mg by mouth.     progesterone (PROMETRIUM) 100 MG capsule Take 100 mg by mouth at bedtime.     valACYclovir (VALTREX) 500 MG tablet Take 1 tablet by mouth twice daily for 3 days prn at first sign or symptom     No current facility-administered medications for this visit.   Known Allergies:  Allergies  Allergen Reactions   Omnicef [Cefdinir] Anaphylaxis   Other Anaphylaxis    Mycin's   Ciprofloxacin    Ciprocin-Fluocin-Procin [Fluocinolone] Rash    Headache   Past Surgical History: Past Surgical History:  Procedure Laterality Date   CESAREAN SECTION  2007   HERNIA REPAIR  2010   Incision hernia repair   Family History: Family History  Problem Relation Age of Onset   Asthma Mother    COPD Mother    Allergies Mother    Cancer - Lung Father    Allergies Maternal Grandmother    COPD Maternal Grandmother    Asthma Maternal Grandmother    Allergies Maternal Aunt    COPD Maternal Aunt    Asthma Maternal Aunt    Social History: Jola lives in an apartment built 60+ years ago, + water damage, area rug in bedroom, electric heating, windows for cooling, no pets, no roaches, not using dust mite covers on the bed of the pillows, smokes cigarettes for 32 years.  Currently vapes since 2024.  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  She is exposed to fumes chemicals  or dust at her work.  + HEPA filter in the home..   ROS:  All other systems negative except as noted per HPI.  Objective:  Blood pressure 122/74, pulse (!) 121, temperature 97.7 F (36.5 C), temperature source Temporal, resp. rate (!) 22, height 5' 7 (1.702 m), weight 194 lb (88 kg), SpO2 94%. Body mass index is 30.38 kg/m. Physical Exam:  General Appearance:  Alert, cooperative, no distress, appears stated age  Head:  Normocephalic, without obvious abnormality, atraumatic  Eyes:  Conjunctiva clear, EOM's intact  Ears EACs normal bilaterally and normal TMs bilaterally  Nose: Nares normal, hypertrophic turbinates and normal mucosa  Throat: Lips, tongue normal; teeth and gums normal, normal posterior oropharynx  Neck: Supple, symmetrical  Lungs:   clear to auscultation bilaterally, Respirations unlabored, no coughing  Heart:  regular rate and rhythm and no murmur, Appears well perfused  Extremities:  No edema  Skin: Skin color, texture, turgor normal and no rashes or lesions on visualized portions of skin  Neurologic: No gross deficits   Diagnostics: Spirometry:  Tracings reviewed. Her effort: Good reproducible efforts. FVC: 3.77L FEV1: 2.87L, 93% predicted  FEV1/FVC ratio: 0.76 Interpretation: Spirometry consistent with normal pattern.  Please see scanned spirometry results for details.  Labs:  Lab Orders  No laboratory test(s) ordered today     Assessment and Plan  Assessment and Plan Assessment & Plan Chronic rhinitis with gustatory component. Chronic rhinitis with perennial symptoms and seasonal exacerbations, likely due to pollen and trees.  - Schedule allergy testing for next Wednesday at 2:30 PM.1-55 - Prescribe Xyzal  5 mg daily as needed for allergy management, to be started after allergy testing. - Advise use of nasal saline spray to maintain nasal moisture.Use prior to medicated nasal sprays.  Gustatory rhinitis Chronic runny nose exacerbated by eating,  consistent with gustatory rhinitis. Ipratropium nasal spray does not cause drowsiness but may cause dryness. - Recommend ipratropium nasal spray up to three times daily, especially before meals. Use saline spray prior to this to prevent dryness.  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 Smoking cessation has improved symptoms. No recent exacerbations requiring steroids. - Perform breathing test today. - Continue Ventolin inhaler as needed for wheezing and shortness of breath. 2-4 puff every 4 hours as needed for wheezing or shortness of breath - Encourage continued smoking cessation efforts. -continue singulair 10 mg nightly - stop if behavior changes or nightmares - consider Pulmonary referral if symptoms worsen  History of smoking Former smoker with a 32-year history, currently using vaping as a cessation aid. Continued vaping may contribute to respiratory issues. - Encourage complete cessation of vaping to further improve respiratory health.  Environmental factors Exposure to mold and mildew in home environment, potentially exacerbating respiratory symptoms. - Suggest use of a dehumidifier to reduce moisture and mold growth in the home.  Gastroesophageal reflux disease (GERD) Intermittent GERD symptoms with occasional heartburn, potentially contributing to throat irritation and mucus production. - Restart Nexium  40 mg daily for six weeks to manage GERD symptoms.     Follow up : next Wednesday July 30th at 2:30 PM (1-55). Must be off all anthistamines 3 days prior to visit. It was a pleasure meeting you in clinic today! Thank you for allowing me to participate in your care.  Rocky Endow, MD [facility]    This note in its entirety was forwarded to the Provider who requested this consultation.  Other: none  Thank you for your kind referral. I appreciate the opportunity to take part in Barri's care. Please do not  hesitate to contact me with questions.  Sincerely,  Rocky Endow, MD [facility] 

## 2024-03-07 ENCOUNTER — Ambulatory Visit: Admitting: Internal Medicine

## 2024-03-21 ENCOUNTER — Encounter: Payer: Self-pay | Admitting: Internal Medicine

## 2024-03-21 ENCOUNTER — Ambulatory Visit (INDEPENDENT_AMBULATORY_CARE_PROVIDER_SITE_OTHER): Admitting: Internal Medicine

## 2024-03-21 DIAGNOSIS — J310 Chronic rhinitis: Secondary | ICD-10-CM

## 2024-03-21 NOTE — Progress Notes (Signed)
 Date of Service/Encounter:  03/21/24  Allergy  testing appointment   Initial visit on 02/29/24, seen for chronic rhinitis.  Please see that note for additional details.  Today reports for allergy  diagnostic testing:    DIAGNOSTICS:  Skin Testing: Environmental allergy  panel. Inadequate positive control-testing invalid. Results discussed with patient/family.  Airborne Adult Perc - 03/21/24 0909     Time Antigen Placed 0900    Allergen Manufacturer Jestine    Location Back    Number of Test 55    1. Control-Buffer 50% Glycerol Negative    2. Control-Histamine Negative    3. Bahia Negative    4. [place] Negative    5. Johnson Negative    6. [place]  Blue Negative    7. Meadow Fescue Negative    8. Perennial Rye Negative    9. Timothy Negative    10. Ragweed Mix Negative    11. Cocklebur Negative    12. Plantain,  English Negative    13. Baccharis Negative    14. Dog Fennel Negative    15. Russian Thistle Negative    16. Lamb's Quarters Negative    17. Sheep Sorrell Negative    18. Rough Pigweed Negative    19. Marsh Elder, Rough Negative    20. Mugwort, Common Negative    21. Box, Elder Negative    22. Cedar, red Negative    23. Sweet Gum Negative    24. Pecan Pollen Negative    25. Pine Mix Negative    26. Walnut, Black Pollen Negative    27. Red Mulberry Negative    28. Ash Mix Negative    29. Birch Mix Negative    30. Beech American Negative    31. Cottonwood, Guinea[place] Negative    32. Hickory, White Negative    33. Maple Mix Negative    34. Oak, [place] Mix Negative    35. Sycamore Eastern Negative    36. Alternaria Alternata Negative    37. Cladosporium Herbarum Negative    38. Aspergillus Mix Negative    39. Penicillium Mix Negative    40. Bipolaris Sorokiniana (Helminthosporium) Negative    41. Drechslera Spicifera (Curvularia) Negative    42. Mucor Plumbeus Negative    43. Fusarium Moniliforme Negative    44. Aureobasidium Pullulans (pullulara)  Negative    45. Rhizopus Oryzae Negative    46. Botrytis Cinera Negative    47. Epicoccum Nigrum Negative    48. Phoma Betae Negative    49. Dust Mite Mix Negative    50. Cat Hair 10,000 BAU/ml Negative    51.  Dog Epithelia Negative    52. Mixed Feathers Negative    53. Horse Epithelia Negative    54. Cockroach, German Negative    55. Tobacco Leaf Negative          Allergy  testing results were read and interpreted by myself, documented by clinical staff.  Patient provided with copy of allergy  testing along with avoidance measures when indicated.   Rocky Endow, MD  [facility]   ---------------------------------------------------------------  Assessment/plan: Chronic rhinitis with gustatory component. Chronic rhinitis with perennial symptoms and seasonal exacerbations, likely due to pollen and trees.  - allergy  testing negative but invalid due due to lack of positive control, labs for confirmation - Prescribe Xyzal  5 mg daily as needed for allergy  management, to be started after allergy  testing. - Advise use of nasal saline spray to maintain nasal moisture.Use prior to medicated nasal sprays.

## 2024-03-21 NOTE — Patient Instructions (Signed)
 Chronic rhinitis with gustatory component. Chronic rhinitis with perennial symptoms and seasonal exacerbations, likely due to pollen and trees.  - allergy  testing negative but invalid due due to lack of positive control, labs for confirmation - Prescribe Xyzal  5 mg daily as needed for allergy  management, to be started after allergy  testing. - Advise use of nasal saline spray to maintain nasal moisture.Use prior to medicated nasal sprays.  Gustatory rhinitis Chronic runny nose exacerbated by eating, consistent with gustatory rhinitis. Ipratropium nasal spray does not cause drowsiness but may cause dryness. - Recommend ipratropium nasal spray up to three times daily, especially before meals. Use saline spray prior to this to prevent dryness.  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 Smoking cessation has improved symptoms. No recent exacerbations requiring steroids. - Perform breathing test today. - Continue Ventolin inhaler as needed for wheezing and shortness of breath. 2-4 puff every 4 hours as needed for wheezing or shortness of breath - Encourage continued smoking cessation efforts. -continue singulair 10 mg nightly - stop if behavior changes or nightmares - consider Pulmonary referral if symptoms worsen   History of smoking Former smoker with a 32-year history, currently using vaping as a cessation aid. Continued vaping may contribute to respiratory issues. - Encourage complete cessation of vaping to further improve respiratory health.  Environmental factors Exposure to mold and mildew in home environment, potentially exacerbating respiratory symptoms. - Suggest use of a dehumidifier to reduce moisture and mold growth in the home.  Gastroesophageal reflux disease (GERD) Intermittent GERD symptoms with occasional heartburn, potentially contributing to throat irritation and mucus production. - Restart Nexium  40 mg daily for six weeks to manage  GERD symptoms.   Follow up : Pending labs.  We will call you once we have your results It was a pleasure seeing you again in clinic today! Thank you for allowing me to participate in your care.  Rocky Endow, MD [facility]

## 2024-03-27 LAB — IGE+ALLERGENS ZONE 2(30)

## 2024-03-28 ENCOUNTER — Ambulatory Visit: Payer: Self-pay | Admitting: Internal Medicine

## 2024-03-28 NOTE — Progress Notes (Signed)
 Please let Rebecca Richard know that her bloodwork was also negative for environmental allergies.

## 2024-04-13 ENCOUNTER — Other Ambulatory Visit: Payer: Self-pay | Admitting: Medical Genetics
